# Patient Record
Sex: Female | Born: 1965 | Race: White | Hispanic: No | Marital: Married | Smoking: Never smoker
Health system: Southern US, Community
[De-identification: ages and names within clinical notes are randomized; demographics above are authoritative.]

## PROBLEM LIST (undated history)

## (undated) DIAGNOSIS — E785 Hyperlipidemia, unspecified: Secondary | ICD-10-CM

## (undated) HISTORY — PX: HERNIA REPAIR: SHX51

## (undated) HISTORY — DX: Hyperlipidemia, unspecified: E78.5

---

## 1998-08-03 ENCOUNTER — Inpatient Hospital Stay (HOSPITAL_COMMUNITY): Admission: AD | Admit: 1998-08-03 | Discharge: 1998-08-03 | Payer: Self-pay | Admitting: Obstetrics and Gynecology

## 1998-08-05 ENCOUNTER — Inpatient Hospital Stay (HOSPITAL_COMMUNITY): Admission: AD | Admit: 1998-08-05 | Discharge: 1998-08-07 | Payer: Self-pay | Admitting: Obstetrics and Gynecology

## 2000-02-01 ENCOUNTER — Encounter: Payer: Self-pay | Admitting: Obstetrics and Gynecology

## 2000-02-01 ENCOUNTER — Ambulatory Visit (HOSPITAL_COMMUNITY): Admission: RE | Admit: 2000-02-01 | Discharge: 2000-02-01 | Payer: Self-pay | Admitting: Obstetrics and Gynecology

## 2000-06-24 ENCOUNTER — Inpatient Hospital Stay (HOSPITAL_COMMUNITY): Admission: AD | Admit: 2000-06-24 | Discharge: 2000-06-26 | Payer: Self-pay | Admitting: Obstetrics and Gynecology

## 2001-12-01 ENCOUNTER — Encounter: Payer: Self-pay | Admitting: Obstetrics and Gynecology

## 2001-12-01 ENCOUNTER — Ambulatory Visit (HOSPITAL_COMMUNITY): Admission: RE | Admit: 2001-12-01 | Discharge: 2001-12-01 | Payer: Self-pay | Admitting: Obstetrics and Gynecology

## 2002-02-05 ENCOUNTER — Encounter: Payer: Self-pay | Admitting: Obstetrics and Gynecology

## 2002-02-05 ENCOUNTER — Ambulatory Visit (HOSPITAL_COMMUNITY): Admission: RE | Admit: 2002-02-05 | Discharge: 2002-02-05 | Payer: Self-pay | Admitting: Obstetrics and Gynecology

## 2002-03-16 ENCOUNTER — Ambulatory Visit (HOSPITAL_COMMUNITY): Admission: RE | Admit: 2002-03-16 | Discharge: 2002-03-16 | Payer: Self-pay | Admitting: Obstetrics and Gynecology

## 2002-03-16 ENCOUNTER — Encounter: Payer: Self-pay | Admitting: Obstetrics and Gynecology

## 2002-04-20 ENCOUNTER — Inpatient Hospital Stay (HOSPITAL_COMMUNITY): Admission: AD | Admit: 2002-04-20 | Discharge: 2002-04-22 | Payer: Self-pay | Admitting: Obstetrics and Gynecology

## 2003-02-28 ENCOUNTER — Other Ambulatory Visit: Admission: RE | Admit: 2003-02-28 | Discharge: 2003-02-28 | Payer: Self-pay | Admitting: Obstetrics and Gynecology

## 2004-03-27 ENCOUNTER — Other Ambulatory Visit: Admission: RE | Admit: 2004-03-27 | Discharge: 2004-03-27 | Payer: Self-pay | Admitting: Obstetrics and Gynecology

## 2004-06-02 ENCOUNTER — Ambulatory Visit: Payer: Self-pay | Admitting: Family Medicine

## 2005-05-07 ENCOUNTER — Other Ambulatory Visit: Admission: RE | Admit: 2005-05-07 | Discharge: 2005-05-07 | Payer: Self-pay | Admitting: Obstetrics and Gynecology

## 2007-02-14 ENCOUNTER — Encounter: Admission: RE | Admit: 2007-02-14 | Discharge: 2007-02-14 | Payer: Self-pay | Admitting: Obstetrics and Gynecology

## 2008-02-19 ENCOUNTER — Encounter: Admission: RE | Admit: 2008-02-19 | Discharge: 2008-02-19 | Payer: Self-pay | Admitting: Family Medicine

## 2009-04-23 ENCOUNTER — Encounter: Admission: RE | Admit: 2009-04-23 | Discharge: 2009-04-23 | Payer: Self-pay | Admitting: Obstetrics and Gynecology

## 2010-11-20 NOTE — Discharge Summary (Signed)
NAME:  Crystal Mayer, Crystal Mayer                        ACCOUNT NO.:  [id number]   MEDICAL RECORD NO.:  [id number]                   PATIENT TYPE:  INP   LOCATION:  9139                                 FACILITY:  [facility]   PHYSICIAN:  Huel Cote, M.D.              DATE OF BIRTH:  September 15, 1965   DATE OF ADMISSION:  04/20/2002  DATE OF DISCHARGE:  04/22/2002                                 DISCHARGE SUMMARY   DISCHARGE DIAGNOSES:  1. Term pregnancy at 38 weeks delivered.  2. Fetal pyelectasis.  3. Status post normal spontaneous vaginal delivery.  4. Advanced maternal age.  5. Group B strep positive status.   DISCHARGE MEDICATIONS:  1. Motrin 600 mg every 6 hours p.r.n.  2. Percocet 1-2 tablets p.o. every 4 hours p.r.n.   DISCHARGE FOLLOW UP:  The patient is to follow up in six weeks for her  routine postpartum exam.   HOSPITAL COURSE:  The patient is a 45 year old G 3, P 2-0-0-2 who was  admitted at 38+ weeks for induction given a favorable cervix and history of  macrosomia.  Prenatal care had been complicated by fetal pyelectasis which  was persistent at 34 weeks and was to be followed up with a postnatal  ultrasound.  The patient was also advanced maternal age; however, declined  amniocentesis and had a positive group B strep status which was to be  treated in labor.   PRENATAL LABORATORY DATA:  A positive, antibody negative, RPR nonreactive,  rubella immune, hepatitis B surface antigen negative, HIV declined. GC  negative, Chlamydia negative, triple screen negative.  One-hour Glucola 107.   PAST OBSTETRICAL HISTORY:  In 2000, she had a 6 pound 15 ounce vacuum  delivery.  In 2001, she had a 9 pound 7 ounce normal spontaneous vaginal  delivery.   PAST GYNECOLOGICAL HISTORY:  None.   PAST MEDICAL HISTORY:  History of asthma not requiring medication.  History  of anxiety.   PAST SURGICAL HISTORY:  Umbilical hernia repair.   HOSPITAL COURSE:  She was afebrile with stable vital  signs on admission,  fetal heart rate was reactive.  Estimated fetal weight was 7 pounds.  Cervix  was 50%, 2-3 cm and minus 2 station.  She had assisted rupture of membranes  with clear fluid obtained and was placed on penicillin for her group B strep  positive status.  She progressed very quickly throughout the day and reached  complete dilation several hours later with a normal spontaneous vaginal  delivery of a vigorous female infant over a first-degree perineal  laceration.  Apgars of 9 and 9.  Weight was 6 pounds 14 ounces.  Placenta  delivered spontaneously.  First-degree laceration was repaired with 2-0  Vicryl for hemostasis and cervix and rectum were intact.  On postpartum day  #2, she was doing very well.  Her pain was well controlled and she was felt  stable for discharge home.  Therefore was discharged for followup in  approximately six weeks for her routine postpartum exam.                                                Huel Cote, M.D.    KR/MEDQ  D:  04/22/2002  T:  04/23/2002  Job:  [id number]

## 2010-11-20 NOTE — Discharge Summary (Signed)
[facility]  Patient:    Crystal Mayer, Crystal Mayer                     MRN: [id number] Adm. Date:  [id number] Disc. Date: 06/26/00 Attending:  Oliver Pila                           Discharge Summary  DISCHARGE DIAGNOSES:          1. Term pregnancy at 39 weeks, delivered.                               2. Status post normal spontaneous vaginal                                  delivery.  DISCHARGE MEDICATIONS:        1. Motrin 600 mg p.o. q.6h. p.r.n.                               2. Percocet one to two tablets p.o. q.4h. p.r.n.  HOSPITAL FOLLOW-UP:           Patient is to follow up in six weeks for her routine postpartum examination.  HOSPITAL COURSE:              Patient is a 45 year old G2, P1-0-0-1 who was admitted at 39+ weeks with complaint of ruptured membranes at about 5:30 a.m. with a slow leak.  She had occasional contractions, no vaginal bleeding. Prenatal care had been uncomplicated.  PRENATAL LABORATORIES:        Blood type A+.  Antibody negative.  RPR negative.  Rubella immune.  Hepatitis B surface antigen.  HIV declined.  GC negative.  Chlamydia negative.  GBS negative.  PAST OBSTETRICAL HISTORY:     In February 2000 patient had a vacuum delivery of a 6 pound 15 ounce infant.  PAST GYNECOLOGICAL HISTORY:   None.  PAST MEDICAL HISTORY:         Patient had asthma which was weather induced only.  PAST SURGICAL HISTORY:        Umbilical hernia repair at 45 years old.  ALLERGIES:                    None.  MEDICATIONS:                  Prenatal vitamins.  PHYSICAL EXAMINATION:  VITAL SIGNS:                  She was afebrile with stable vital signs.  Fetal heart rate was reactive.  She had irregular mild contractions.  PELVIC:                       Cervix:  80% effaced, 4-5 cm, -1 station with positive gross fluid noted and positive four bag.                                Patient was admitted and had rupture of membranes of four  bag and then progressed to complete dilation with minimal Pitocin.  She pushed well with a normal spontaneous vaginal delivery of a vigorous female infant over a  midline episiotomy.  Apgars were 9 and 9.  Weight was 9 pounds 7 ounces.  Placenta delivered spontaneously with a three vessel cord.  Midline episiotomy was repaired with 2-0 and 3-0 Vicryl.  Some revision of her old episiotomy scar was necessary on the perineal surface for redundant mucosa, however, the patients cervix and rectum were intact.  EBL was less than 500 cc.  On postpartum day #2 she was doing great, afebrile.  Vital signs were stable.  She was breast-feeding without problem.  Her lochia was normal. Therefore, she was discharged to home with follow-up and medications as previously stated. DD:  06/26/00 TD:  06/26/00 Job: [id number]

## 2011-04-07 ENCOUNTER — Other Ambulatory Visit (HOSPITAL_COMMUNITY): Payer: Self-pay | Admitting: Obstetrics and Gynecology

## 2011-04-07 DIAGNOSIS — Z1231 Encounter for screening mammogram for malignant neoplasm of breast: Secondary | ICD-10-CM

## 2011-04-28 ENCOUNTER — Ambulatory Visit (HOSPITAL_COMMUNITY)
Admission: RE | Admit: 2011-04-28 | Discharge: 2011-04-28 | Disposition: A | Discharge status: Home / Self Care | Payer: Managed Care, Other (non HMO) | Source: Ambulatory Visit | Attending: Obstetrics and Gynecology | Admitting: Obstetrics and Gynecology

## 2011-04-28 DIAGNOSIS — Z1231 Encounter for screening mammogram for malignant neoplasm of breast: Secondary | ICD-10-CM

## 2011-05-28 ENCOUNTER — Other Ambulatory Visit: Payer: Self-pay | Admitting: Neurology

## 2013-03-28 ENCOUNTER — Other Ambulatory Visit (HOSPITAL_COMMUNITY): Payer: Self-pay | Admitting: Family Medicine

## 2013-03-28 DIAGNOSIS — Z1231 Encounter for screening mammogram for malignant neoplasm of breast: Secondary | ICD-10-CM

## 2013-04-04 ENCOUNTER — Ambulatory Visit (HOSPITAL_COMMUNITY)
Admission: RE | Admit: 2013-04-04 | Discharge: 2013-04-04 | Disposition: A | Discharge status: Home / Self Care | Payer: Managed Care, Other (non HMO) | Source: Ambulatory Visit | Attending: Family Medicine | Admitting: Family Medicine

## 2013-04-04 DIAGNOSIS — Z1231 Encounter for screening mammogram for malignant neoplasm of breast: Secondary | ICD-10-CM | POA: Insufficient documentation

## 2013-04-10 ENCOUNTER — Other Ambulatory Visit: Payer: Self-pay | Admitting: Family Medicine

## 2013-04-10 DIAGNOSIS — R928 Other abnormal and inconclusive findings on diagnostic imaging of breast: Secondary | ICD-10-CM

## 2013-04-23 ENCOUNTER — Other Ambulatory Visit: Payer: Self-pay | Admitting: Family Medicine

## 2013-04-23 ENCOUNTER — Ambulatory Visit
Admission: RE | Admit: 2013-04-23 | Discharge: 2013-04-23 | Disposition: A | Discharge status: Home / Self Care | Payer: Managed Care, Other (non HMO) | Source: Ambulatory Visit | Attending: Family Medicine | Admitting: Family Medicine

## 2013-04-23 DIAGNOSIS — R921 Mammographic calcification found on diagnostic imaging of breast: Secondary | ICD-10-CM

## 2013-04-23 DIAGNOSIS — R928 Other abnormal and inconclusive findings on diagnostic imaging of breast: Secondary | ICD-10-CM

## 2013-04-26 ENCOUNTER — Ambulatory Visit
Admission: RE | Admit: 2013-04-26 | Discharge: 2013-04-26 | Disposition: A | Discharge status: Home / Self Care | Payer: Managed Care, Other (non HMO) | Source: Ambulatory Visit | Attending: Family Medicine | Admitting: Family Medicine

## 2013-04-26 ENCOUNTER — Other Ambulatory Visit: Payer: Self-pay | Admitting: Family Medicine

## 2013-04-26 DIAGNOSIS — R921 Mammographic calcification found on diagnostic imaging of breast: Secondary | ICD-10-CM

## 2014-04-11 ENCOUNTER — Other Ambulatory Visit (HOSPITAL_COMMUNITY): Payer: Self-pay | Admitting: Obstetrics and Gynecology

## 2014-04-11 DIAGNOSIS — Z1231 Encounter for screening mammogram for malignant neoplasm of breast: Secondary | ICD-10-CM

## 2014-04-30 ENCOUNTER — Ambulatory Visit (HOSPITAL_COMMUNITY)
Admission: RE | Admit: 2014-04-30 | Discharge: 2014-04-30 | Disposition: A | Discharge status: Home / Self Care | Payer: Managed Care, Other (non HMO) | Source: Ambulatory Visit | Attending: Obstetrics and Gynecology | Admitting: Obstetrics and Gynecology

## 2014-04-30 DIAGNOSIS — Z1231 Encounter for screening mammogram for malignant neoplasm of breast: Secondary | ICD-10-CM

## 2014-05-02 ENCOUNTER — Other Ambulatory Visit: Payer: Self-pay | Admitting: Obstetrics and Gynecology

## 2014-05-02 DIAGNOSIS — R928 Other abnormal and inconclusive findings on diagnostic imaging of breast: Secondary | ICD-10-CM

## 2014-05-15 ENCOUNTER — Ambulatory Visit
Admission: RE | Admit: 2014-05-15 | Discharge: 2014-05-15 | Disposition: A | Discharge status: Home / Self Care | Payer: Managed Care, Other (non HMO) | Source: Ambulatory Visit | Attending: Obstetrics and Gynecology | Admitting: Obstetrics and Gynecology

## 2014-05-15 DIAGNOSIS — R928 Other abnormal and inconclusive findings on diagnostic imaging of breast: Secondary | ICD-10-CM

## 2014-05-22 ENCOUNTER — Inpatient Hospital Stay: Admission: RE | Admit: 2014-05-22 | Payer: Managed Care, Other (non HMO) | Source: Ambulatory Visit

## 2014-05-22 ENCOUNTER — Other Ambulatory Visit: Payer: Managed Care, Other (non HMO)

## 2014-10-03 ENCOUNTER — Ambulatory Visit (INDEPENDENT_AMBULATORY_CARE_PROVIDER_SITE_OTHER): Payer: BC Managed Care – PPO | Admitting: Podiatry

## 2014-10-03 ENCOUNTER — Ambulatory Visit (INDEPENDENT_AMBULATORY_CARE_PROVIDER_SITE_OTHER): Payer: BC Managed Care – PPO

## 2014-10-03 ENCOUNTER — Encounter: Payer: Self-pay | Admitting: Podiatry

## 2014-10-03 VITALS — BP 96/63 | HR 72 | Resp 10 | Ht 60.0 in | Wt 120.0 lb

## 2014-10-03 DIAGNOSIS — M79671 Pain in right foot: Secondary | ICD-10-CM

## 2014-10-03 DIAGNOSIS — M2040 Other hammer toe(s) (acquired), unspecified foot: Secondary | ICD-10-CM | POA: Diagnosis not present

## 2014-10-03 DIAGNOSIS — M2011 Hallux valgus (acquired), right foot: Secondary | ICD-10-CM

## 2014-10-03 DIAGNOSIS — M79675 Pain in left toe(s): Secondary | ICD-10-CM

## 2014-10-03 DIAGNOSIS — M21611 Bunion of right foot: Secondary | ICD-10-CM

## 2014-10-03 NOTE — Progress Notes (Signed)
   Subjective:    Patient ID: Crystal Mayer, female    DOB: [birth year], 49 y.o.   MRN: [id number]  HPI Comments: Pt states she has begun to have pain and redness to the right 1st MPJ for about 5 months.  Pt states she was evaluated by DR. Petrinitz over 2 years ago for the right 2nd hammer toe, and in the last 5 months left 2nd MPJ has developed a hammer toe.     Review of Systems  HENT: Positive for hearing loss.   All other systems reviewed and are negative.      Objective:   Physical Exam        Assessment & Plan:

## 2014-10-03 NOTE — Patient Instructions (Signed)
Pre-Operative Instructions  Congratulations, you have decided to take an important step to improving your quality of life.  You can be assured that the doctors of [facility] will be with you every step of the way.  1. Plan to be at the surgery center/hospital at least 1 (one) hour prior to your scheduled time unless otherwise directed by the surgical center/hospital staff.  You must have a responsible adult accompany you, remain during the surgery and drive you home.  Make sure you have directions to the surgical center/hospital and know how to get there on time. 2. For hospital based surgery you will need to obtain a history and physical form from your family physician within 1 month prior to the date of surgery- we will give you a form for you primary physician.  3. We make every effort to accommodate the date you request for surgery.  There are however, times where surgery dates or times have to be moved.  We will contact you as soon as possible if a change in schedule is required.   4. No Aspirin/Ibuprofen for one week before surgery.  If you are on aspirin, any non-steroidal anti-inflammatory medications (Mobic, Aleve, Ibuprofen) you should stop taking it 7 days prior to your surgery.  You make take Tylenol  For pain prior to surgery.  5. Medications- If you are taking daily heart and blood pressure medications, seizure, reflux, allergy, asthma, anxiety, pain or diabetes medications, make sure the surgery center/hospital is aware before the day of surgery so they may notify you which medications to take or avoid the day of surgery. 6. No food or drink after midnight the night before surgery unless directed otherwise by surgical center/hospital staff. 7. No alcoholic beverages 24 hours prior to surgery.  No smoking 24 hours prior to or 24 hours after surgery. 8. Wear loose pants or shorts- loose enough to fit over bandages, boots, and casts. 9. No slip on shoes, sneakers are best. 10. Bring  your boot with you to the surgery center/hospital.  Also bring crutches or a walker if your physician has prescribed it for you.  If you do not have this equipment, it will be provided for you after surgery. 11. If you have not been contracted by the surgery center/hospital by the day before your surgery, call to confirm the date and time of your surgery. 12. Leave-time from work may vary depending on the type of surgery you have.  Appropriate arrangements should be made prior to surgery with your employer. 13. Prescriptions will be provided immediately following surgery by your doctor.  Have these filled as soon as possible after surgery and take the medication as directed. 14. Remove nail polish on the operative foot. 15. Wash the night before surgery.  The night before surgery wash the foot and leg well with the antibacterial soap provided and water paying special attention to beneath the toenails and in between the toes.  Rinse thoroughly with water and dry well with a towel.  Perform this wash unless told not to do so by your physician.  Enclosed: 1 Ice pack (please put in freezer the night before surgery)   1 Hibiclens skin cleaner   Pre-op Instructions  If you have any questions regarding the instructions, do not hesitate to call our office.  [place]: [address] [place], [place] [address] [phone]  [place]: [address] [place], [place] [address] [phone]  [place]: [address]  [place], [place] [address] [phone]  Dr. Richard   Tuchman DPM, Dr. Norman Regal DPM Dr. Richard Sikora DPM, Dr. M. Todd Hyatt DPM, Dr. Kathryn Egerton DPM 

## 2014-10-17 ENCOUNTER — Encounter: Payer: Self-pay | Admitting: Podiatry

## 2014-10-17 ENCOUNTER — Ambulatory Visit (INDEPENDENT_AMBULATORY_CARE_PROVIDER_SITE_OTHER): Payer: BC Managed Care – PPO | Admitting: Podiatry

## 2014-10-17 VITALS — BP 102/67 | HR 60 | Resp 12

## 2014-10-17 DIAGNOSIS — M2040 Other hammer toe(s) (acquired), unspecified foot: Secondary | ICD-10-CM

## 2014-10-17 DIAGNOSIS — M2011 Hallux valgus (acquired), right foot: Secondary | ICD-10-CM | POA: Diagnosis not present

## 2014-10-17 DIAGNOSIS — M21611 Bunion of right foot: Secondary | ICD-10-CM

## 2014-10-17 DIAGNOSIS — M79671 Pain in right foot: Secondary | ICD-10-CM

## 2014-10-18 NOTE — Progress Notes (Signed)
Subjective:     Patient ID: Crystal SilviusStephanie R Mayer, female   DOB: [birth year], 49 y.o.   MRN: [id number]  HPI patient presents for correction of bunion right foot and hammertoe deformity of the second toes of both feet. States that it's been going on for a long time getting worse over time and that she has difficulty walking or wearing shoe gear. States that she's tried wider shoes and padding without relief   Review of Systems     Objective:   Physical Exam Neurovascular status intact muscle strength adequate with range of motion subtalar midtarsal joint within normal limits. 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 Patient wants surgical intervention and I allowed her to read consent form for correction reviewing with her all possible complications and alternative treatments as listed. She understands is no guarantee of the told recovery. Take 6 months to one year and she is [facility] the next several weeks and is dispensed air fracture walker with her right foot with instructions on usage

## 2014-10-21 ENCOUNTER — Telehealth: Payer: Self-pay | Admitting: Podiatry

## 2014-10-21 NOTE — Telephone Encounter (Signed)
Patient is currently scheduled for surgery on 05/10 and wants to reschedule it to 08/09. Please call her at [phone]. Thank you.

## 2014-10-22 NOTE — Telephone Encounter (Signed)
I called and left patient a message that I will reschedule surgery from 11/12/2014 to 02/11/2015.  Call if you would like to make any further changes.  I called surgical center and rescheduled surgery.

## 2014-10-22 NOTE — Telephone Encounter (Signed)
New post-op appointment is Monday 02/17/2015 @ 1:15pm.

## 2014-10-23 ENCOUNTER — Telehealth: Payer: Self-pay | Admitting: *Deleted

## 2014-10-23 NOTE — Telephone Encounter (Signed)
I called patient to see if she had called.  "No, I called yesterday but I hadn't called back.  You left me a message that it was fine to reschedule my surgery.  I do have a question.  They gave me a boot.  Should I return it or just hold on to it until time of surgery.  You can hold on it until surgery date or you can return it, it's left up to you.  "Alright, thanks for calling me back."

## 2014-12-18 IMAGING — MG MM DIAGNOSTIC LTD LEFT
2 series · 2 of 2 positions shown · non-contrast
Comparison: Multiple priors

CLINICAL DATA: Abnormal screening mammogram

EXAM:
DIGITAL DIAGNOSTIC  LEFT MAMMOGRAM

[L CC]
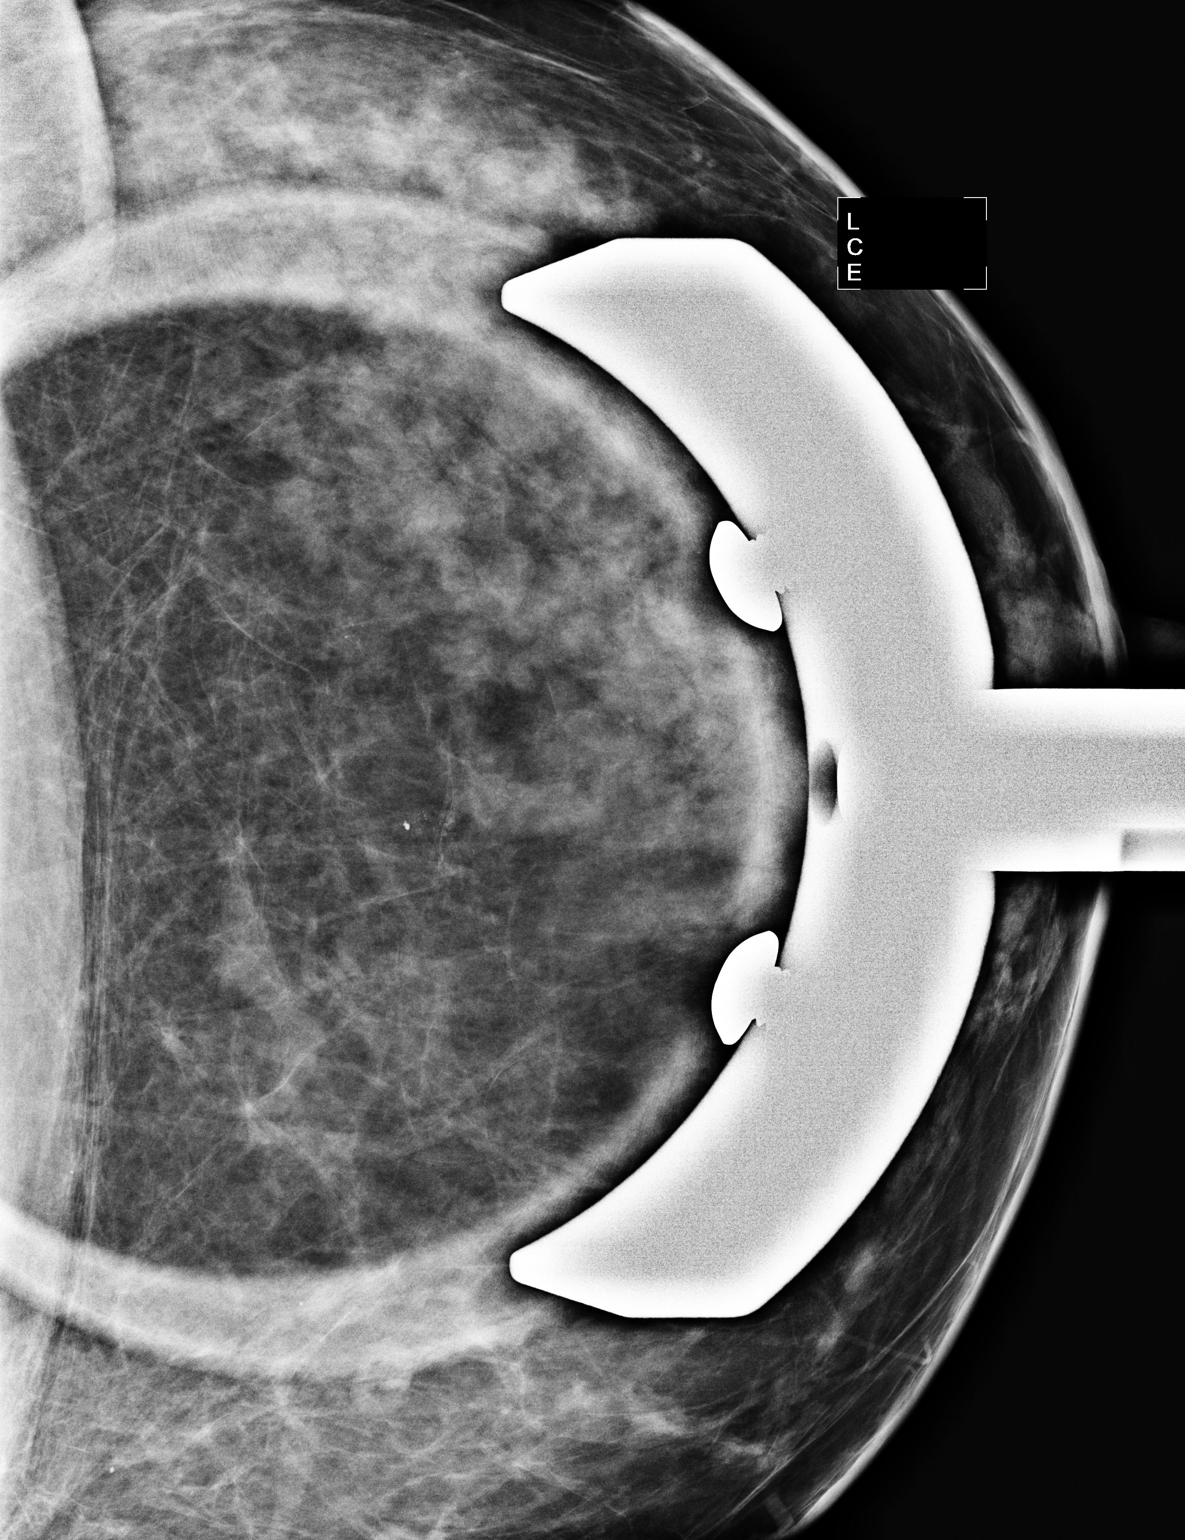

[L ML]
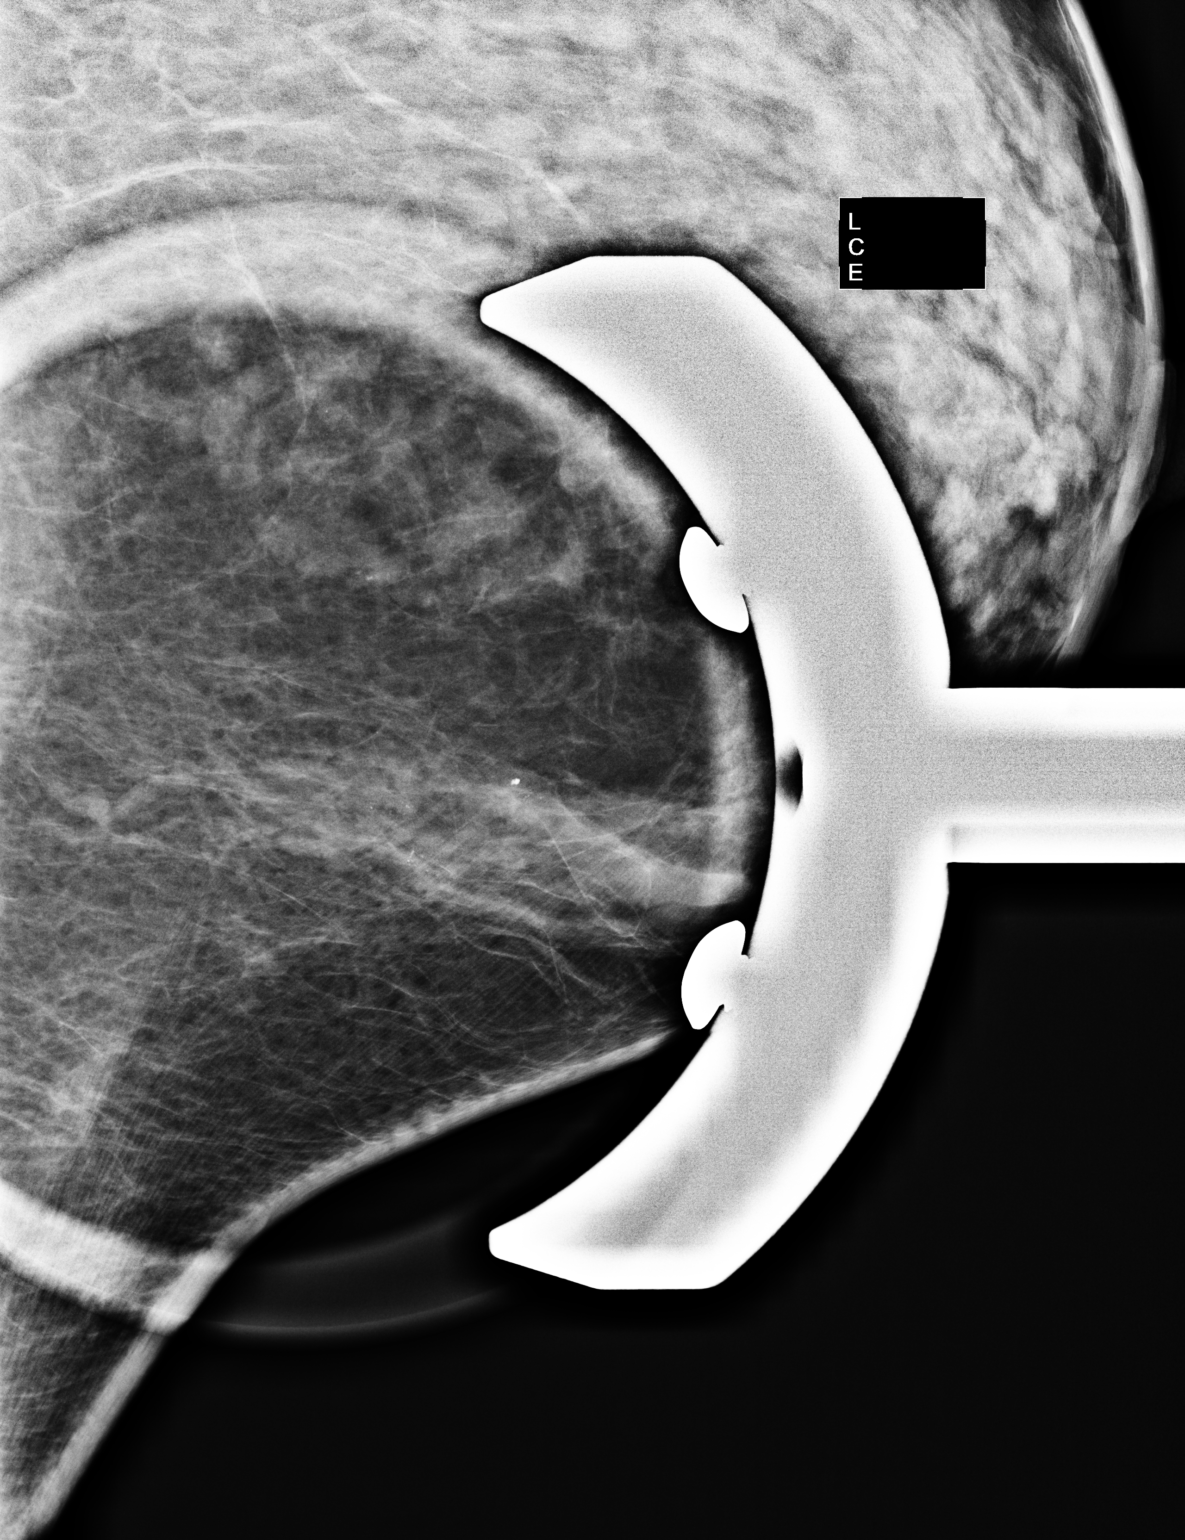

[2 of 2 positions shown; findings below may reference images not displayed]

ACR Breast Density Category b: There are scattered areas of
fibroglandular density.
FINDINGS: Two groups of calcifications are identified in the central lower
left breast. The more lateral group measures 2 x 2 x 3 mm. The more
medial group measures 2 x 3 x 5 mm and contains a single coarse
calcification. These groups are separated by 1.4 cm.
IMPRESSION: Suspicious left breast calcifications.

RECOMMENDATION:
Stereotactic left breast biopsy is recommended.

I have discussed the findings and recommendations with the patient.
Results were also provided in writing at the conclusion of the
visit. If applicable, a reminder letter will be sent to the patient
regarding the next appointment.

BI-RADS CATEGORY  4: Suspicious abnormality - biopsy should be
considered.

## 2014-12-21 IMAGING — MG MM BREAST STEREO BIOPSY LEFT
4 series · 4 of 4 positions shown · non-contrast
Comparison: Previous exams.

ADDENDUM:
Histologic evaluation demonstrates benign breast tissue
withcalcifications. Calcifications are present and benign lobules.
There is fibrocystic change, usual ductal hyperplasia and columnar
hyperplasia/change. No atypia or malignancy is identified. These
findings are concordant with the imaging findings. Results were
discussedwith the patient by telephone at her request. She reports a
large amount of bruising which would be expected given the hematoma
formation after the procedure. The patient was counseled that the
bruising and discomfort may last for several months. Yearly
screening mammography is suggested.
CLINICAL DATA: Left breast calcifications; 2 sites

EXAM:
STEREOTACTIC CORE NEEDLE BIOPSY; RADIOLOGY EXAMINATION

[L SPECIMEN (1 of 2)]
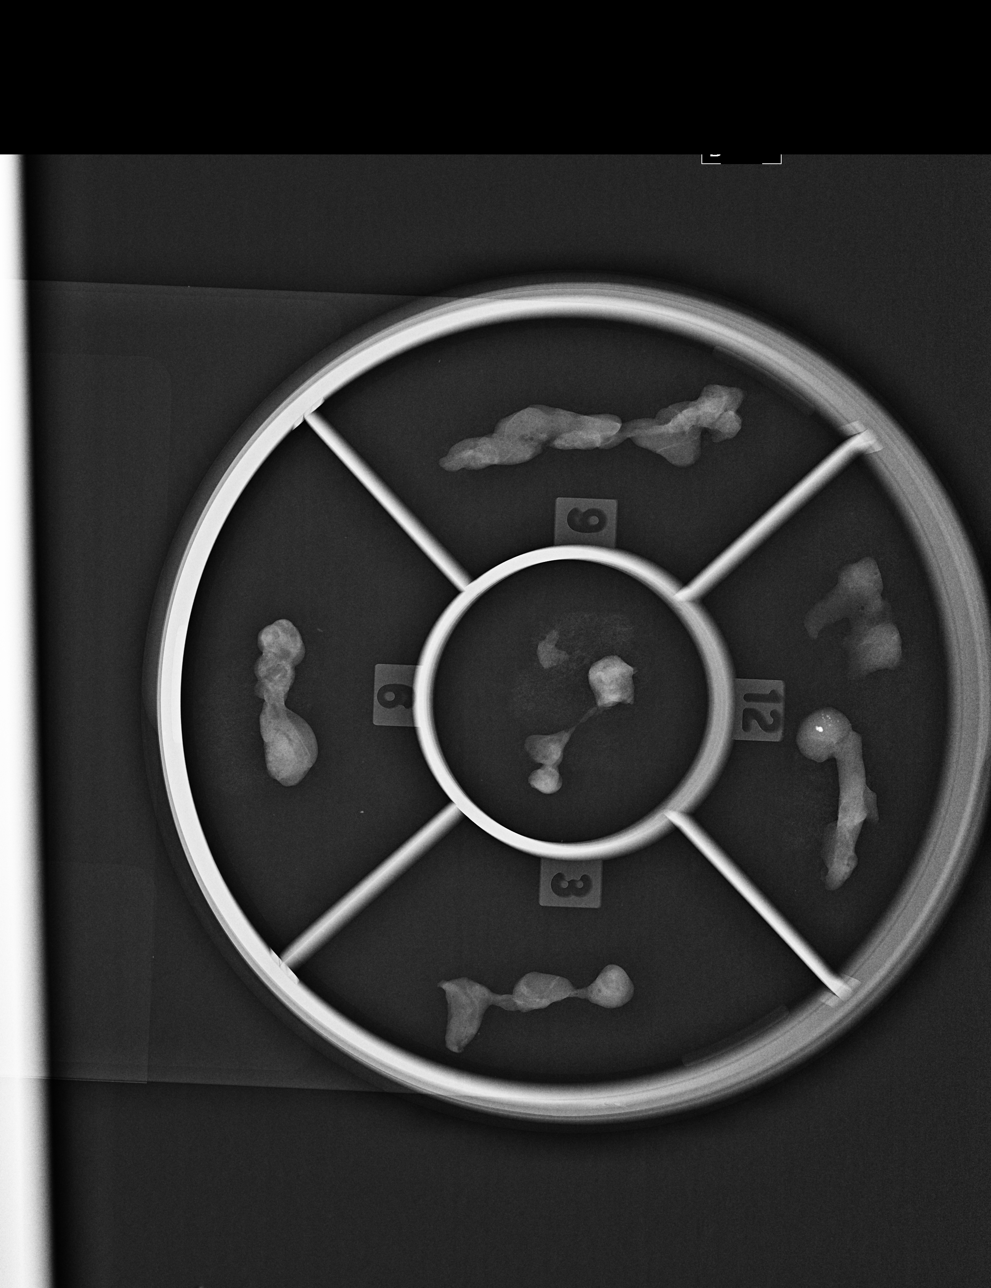

[L CC]
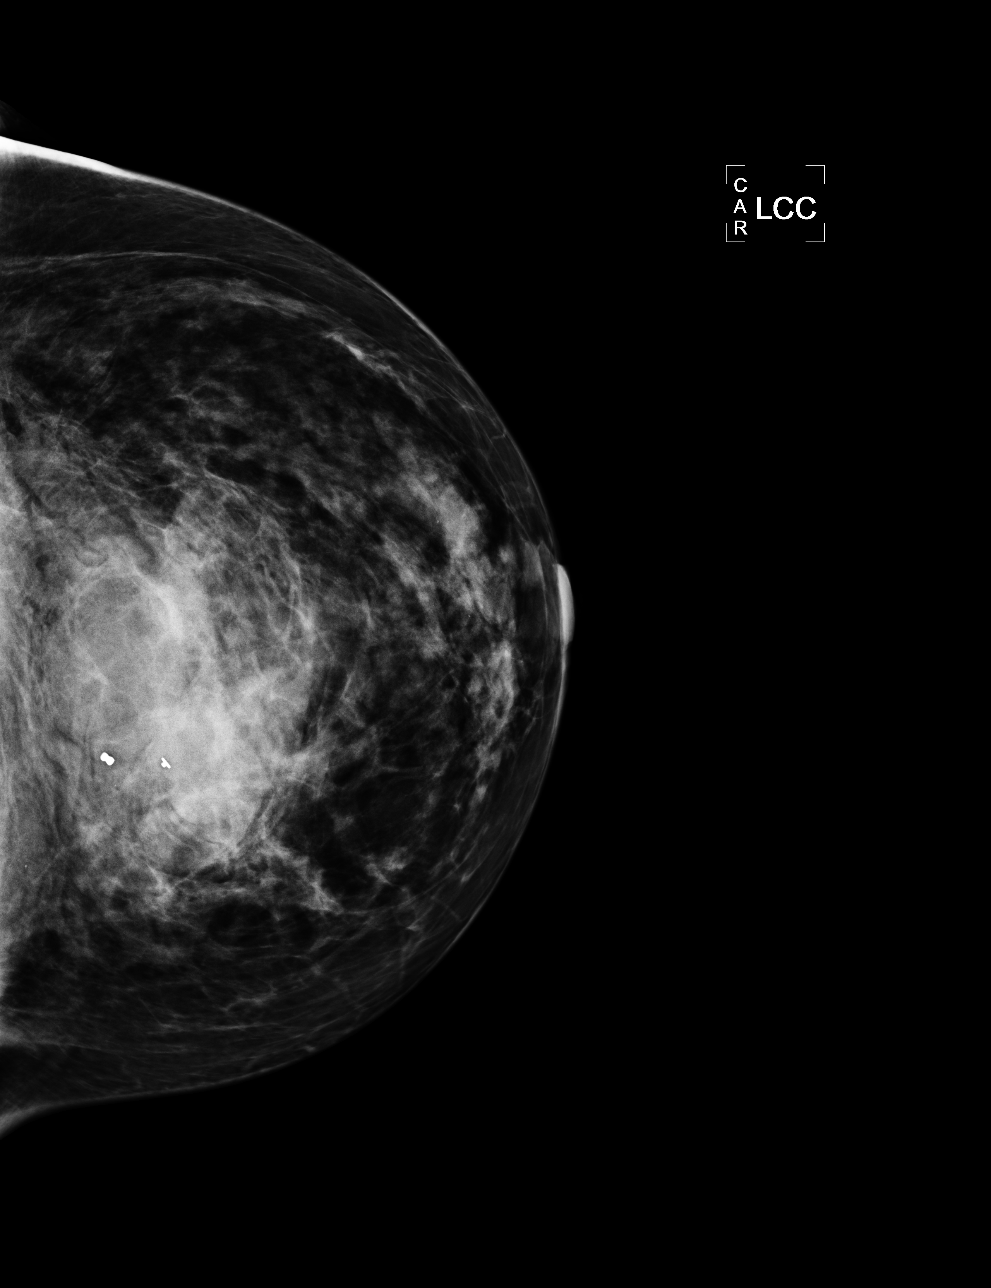

[L ML]
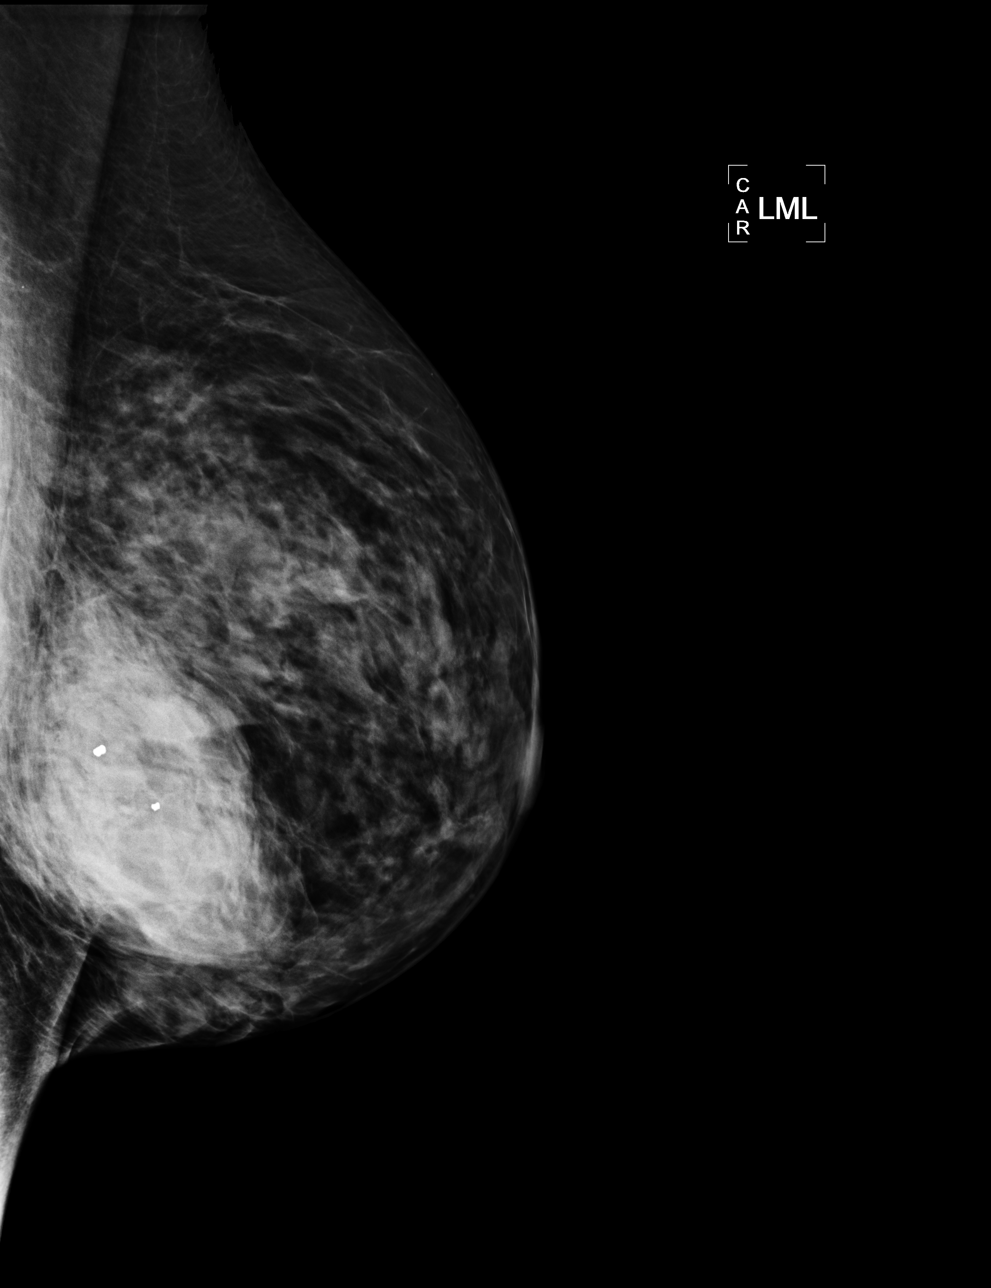

[L SPECIMEN (2 of 2)]
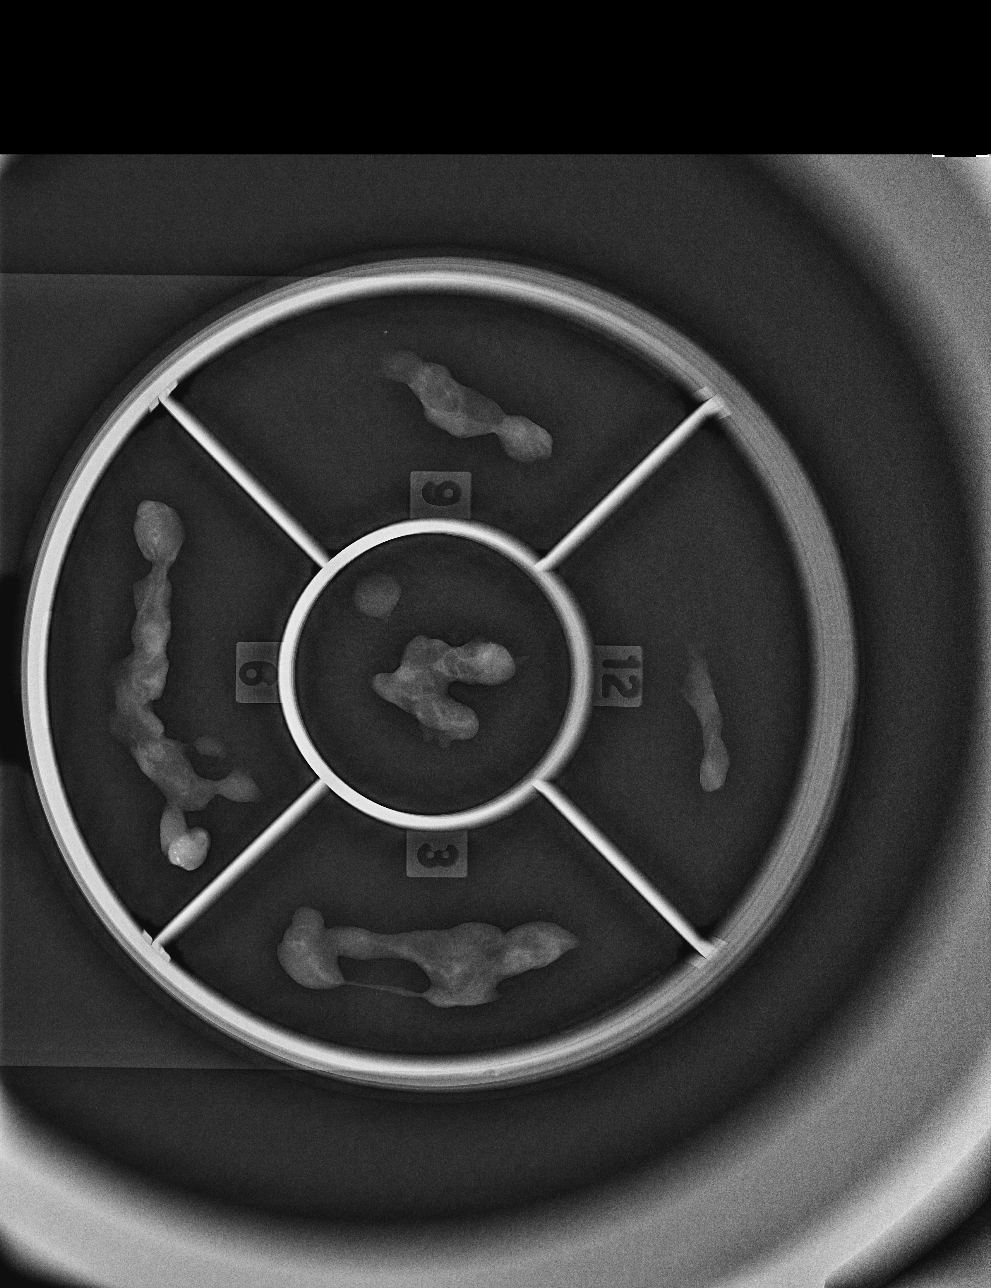

[4 of 4 positions shown; findings below may reference images not displayed]

FINDINGS: I met with the patient and we discussed the procedure of
stereotactic-guided biopsy, including benefits and alternatives. We
discussed the high likelihood of a successful procedure. We
discussed the risks of the procedure, including infection, bleeding,
tissue injury, clip migration, and inadequate sampling. Informed,
written consent was given.

Site 1: Dumbbell clip; lateral

Using sterile technique and 2% Lidocaine as local anesthetic, under
stereotactic guidance, a 9 gauge vacuum assisted device was used to
perform core needle biopsy of calcifications within the 6 o'clock
position left breast using an inferior approach. Specimen radiograph
was performed, showing calcifications. Specimens with calcifications
are identified for pathology.

At the conclusion of the procedure, a dumbbell shaped tissue marker
clip was deployed into the biopsy cavity. Follow-up 2-view mammogram
confirmed clip in appropriate position.

Site 2:  T Shaped clip; medial

Using sterile technique and 2% Lidocaine as local anesthetic, under
stereotactic guidance, a 9 gauge vacuum assisted device was used to
perform core needle biopsy of calcifications in the 6 o'clock
position left breast using an inferior approach. Specimen radiograph
was performed, showing calcifications. Specimens with calcifications
are identified for pathology.

At the conclusion of the procedure, a T-shaped tissue marker clip
was deployed into the biopsy cavity. Follow-up 2-view mammogram
confirmed clip in appropriate position.
IMPRESSION: 1. Stereotactic-guided biopsy of left breast calcifications, 2
sites. (Site 1: Dumbbell clip) (Site 2: T-shaped clip).
2. There was hematoma formation after biopsy of the 2nd site.
Patient was advised to keep the pressure bandages in place as well
as use ice on the left intermittently for the next few hours. She
was advised to call the office if she felt the hematoma was
enlarging or and she had any unexpected bleeding.

## 2015-01-13 ENCOUNTER — Telehealth: Payer: Self-pay | Admitting: *Deleted

## 2015-01-13 NOTE — Telephone Encounter (Signed)
"  I'm scheduled for surgery on 02/11/2015.  I need to reschedule to 06/24/2015.  Is that date available?"  As far as I know that date is available.  He has not informed whether or not if he's taking any time off for the Holidays.  "You'll let me know if he decides to take time off during that time correct?"  Yes, I will let you know.  "Okay, thank you."  I called and rescheduled surgery at the surgical center.

## 2015-04-10 ENCOUNTER — Telehealth: Payer: Self-pay | Admitting: *Deleted

## 2015-04-10 NOTE — Telephone Encounter (Signed)
I left messages for patient to give me a call back regarding rescheduling appointment date.  I need to reschedule patient's surgery date, Dr. Charlsie Merles will not be performing surgery on 06/24/2015/

## 2015-04-15 NOTE — Telephone Encounter (Signed)
"  I'm returning your call.  You said I needed to reschedule my surgery."  Yes, Dr. Charlsie Merles is going to be out of the office the week of the 20th.  So, we will need to reschedule your surgery.  He can do it December 13, 15 or 27.  "Go ahead and put me down for the 27th.  You said I have the option of maybe doing it at lunch time on the 15th.  Let me check with my husband and see what's good for him because he's going to have to drive me to and from [place].  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  I'll let you know if I need to reschedule it."

## 2015-04-16 ENCOUNTER — Telehealth: Payer: Self-pay | Admitting: *Deleted

## 2015-04-16 NOTE — Telephone Encounter (Signed)
"  I spoke to you yesterday about rescheduling my surgery.  I had asked you to put me down for 12/27.  I spoke with my husband and he felt 12/15 would be better.  Is that date still available?"  Yes, it is.  I'll reschedule it to 06/19/2015.  I called and rescheduled surgery from 07/01/2015 to 06/19/2015 with [facility].

## 2015-06-06 ENCOUNTER — Telehealth: Payer: Self-pay | Admitting: *Deleted

## 2015-06-06 NOTE — Telephone Encounter (Signed)
Pt has surgery questions. I answered surgery to the best of my ability.

## 2015-06-16 ENCOUNTER — Telehealth: Payer: Self-pay | Admitting: *Deleted

## 2015-06-16 NOTE — Telephone Encounter (Signed)
"  I'm scheduled for surgery on Thursday.  I rescheduled this surgery about 4 times since April.  I don't know the arrival time."  Surgical center will call you a day or two prior to surgery date.  They will give you the arrival time.  "I need to know so I can make arrangements.  My husband needs to know what time he needs to take off from work.  This has been the most random experience I've ever had."  You are welcome to call the surgical center.  They may be able to give you the arrival time.

## 2015-06-18 DIAGNOSIS — M2042 Other hammer toe(s) (acquired), left foot: Secondary | ICD-10-CM | POA: Diagnosis not present

## 2015-06-18 DIAGNOSIS — M2011 Hallux valgus (acquired), right foot: Secondary | ICD-10-CM | POA: Diagnosis not present

## 2015-06-18 DIAGNOSIS — M2041 Other hammer toe(s) (acquired), right foot: Secondary | ICD-10-CM | POA: Diagnosis not present

## 2015-06-19 ENCOUNTER — Encounter: Payer: Self-pay | Admitting: Podiatry

## 2015-06-20 ENCOUNTER — Telehealth: Payer: Self-pay | Admitting: *Deleted

## 2015-06-20 MED ORDER — OXYCODONE-ACETAMINOPHEN 10-325 MG PO TABS: 1.0000 | ORAL_TABLET | Freq: Three times a day (TID) | ORAL | Status: DC | PRN

## 2015-06-20 NOTE — Telephone Encounter (Signed)
Pt states had surgery and the hammer toe has an area of red blood the larger than the size of a 50 cent piece.  Told pt to come to the office for evaluation and possible reinforcement of the dressing.

## 2015-06-26 ENCOUNTER — Ambulatory Visit (INDEPENDENT_AMBULATORY_CARE_PROVIDER_SITE_OTHER): Payer: BC Managed Care – PPO

## 2015-06-26 ENCOUNTER — Ambulatory Visit (INDEPENDENT_AMBULATORY_CARE_PROVIDER_SITE_OTHER): Payer: BC Managed Care – PPO | Admitting: Podiatry

## 2015-06-26 VITALS — Temp 98.3°F

## 2015-06-26 DIAGNOSIS — Z9889 Other specified postprocedural states: Secondary | ICD-10-CM

## 2015-06-26 DIAGNOSIS — M21611 Bunion of right foot: Secondary | ICD-10-CM

## 2015-06-26 DIAGNOSIS — M79671 Pain in right foot: Secondary | ICD-10-CM

## 2015-06-26 DIAGNOSIS — M21612 Bunion of left foot: Secondary | ICD-10-CM

## 2015-06-30 NOTE — Progress Notes (Signed)
She presents today 1 week status post [facility] right foot, hammertoe repair with pins second digit bilateral. She denies fever chills nausea vomiting muscle aches and pains. She presents today Cam Walker right foot Darco shoe left foot.  Objective: Vital signs stable alert and oriented 3. Pulses are strongly palpable. Dry sterile dressings intact was removed demonstrates well-healing surgical sites. Sutures are intact margins well coapted minimal edema no erythema cellulitis drainage or odor. Radiographs demonstrate good position pins and toes bilateral.  Assessment: Well-healing surgical feet first week postop.  Plan: Redressed today dry sterile compressive dressing she will follow up with her primary surgeon in 1 week. Sutures will be removed at that point.

## 2015-07-01 ENCOUNTER — Other Ambulatory Visit: Payer: Self-pay

## 2015-07-03 ENCOUNTER — Other Ambulatory Visit: Payer: Self-pay

## 2015-07-09 ENCOUNTER — Ambulatory Visit (INDEPENDENT_AMBULATORY_CARE_PROVIDER_SITE_OTHER): Payer: BC Managed Care – PPO | Admitting: Podiatry

## 2015-07-09 DIAGNOSIS — M2040 Other hammer toe(s) (acquired), unspecified foot: Secondary | ICD-10-CM | POA: Diagnosis not present

## 2015-07-09 DIAGNOSIS — Z9889 Other specified postprocedural states: Secondary | ICD-10-CM | POA: Diagnosis not present

## 2015-07-09 DIAGNOSIS — M21611 Bunion of right foot: Secondary | ICD-10-CM | POA: Diagnosis not present

## 2015-07-10 ENCOUNTER — Other Ambulatory Visit: Payer: Self-pay

## 2015-07-11 ENCOUNTER — Other Ambulatory Visit: Payer: Self-pay

## 2015-07-14 NOTE — Progress Notes (Signed)
Subjective:     Patient ID: Crystal Mayer, female   DOB: Apr 25, 1966, 50 y.o.   MRN: [id number]  HPI patient states I'm doing very well with my right foot with minimal discomfort and able to walk distances without significant pain   Review of Systems     Objective:   Physical Exam  neurovascular status intact negative Homans sign was noted and wound edges well coapted with good alignment of the first metatarsal and digits with stitches in place and    Assessment:      doing well post osteotomy arthroplasty    Plan:      advised this patient on wider-type shoes utilizing immobilization and continue compression elevation and reviewed x-rays with patient

## 2015-07-23 ENCOUNTER — Ambulatory Visit (INDEPENDENT_AMBULATORY_CARE_PROVIDER_SITE_OTHER): Payer: BC Managed Care – PPO | Admitting: Podiatry

## 2015-07-23 ENCOUNTER — Ambulatory Visit (INDEPENDENT_AMBULATORY_CARE_PROVIDER_SITE_OTHER): Payer: BC Managed Care – PPO

## 2015-07-23 DIAGNOSIS — M2040 Other hammer toe(s) (acquired), unspecified foot: Secondary | ICD-10-CM

## 2015-07-23 DIAGNOSIS — M21611 Bunion of right foot: Secondary | ICD-10-CM | POA: Diagnosis not present

## 2015-07-23 DIAGNOSIS — Z9889 Other specified postprocedural states: Secondary | ICD-10-CM | POA: Diagnosis not present

## 2015-07-24 ENCOUNTER — Other Ambulatory Visit: Payer: BC Managed Care – PPO

## 2015-07-24 NOTE — Progress Notes (Signed)
Subjective:     Patient ID: Crystal Mayer, female   DOB: 1965/12/30, 50 y.o.   MRN: [id number]  HPI patient states I'm doing real well with minimal discomfort and able to walk distances without pain   Review of Systems     Objective:   Physical Exam Neurovascular status intact. In place second digit bilateral with good structural correction first MPJ right with wound edges well coapted    Assessment:     Doing well post osteotomy right digital fusion second right with pins in place and second left    Plan:     X-rays taken reviewed pins removed with sterile dressings applied and instructed on gradual return to saw shoe gear in the next 4 weeks or earlier if needed and reappoint 6 weeks or earlier if needed

## 2015-08-21 ENCOUNTER — Other Ambulatory Visit: Payer: BC Managed Care – PPO

## 2015-08-25 ENCOUNTER — Ambulatory Visit (INDEPENDENT_AMBULATORY_CARE_PROVIDER_SITE_OTHER): Payer: BC Managed Care – PPO | Admitting: Podiatry

## 2015-08-25 ENCOUNTER — Encounter: Payer: Self-pay | Admitting: Podiatry

## 2015-08-25 ENCOUNTER — Ambulatory Visit: Payer: Self-pay

## 2015-08-25 ENCOUNTER — Ambulatory Visit (INDEPENDENT_AMBULATORY_CARE_PROVIDER_SITE_OTHER): Payer: BC Managed Care – PPO

## 2015-08-25 VITALS — BP 113/65 | HR 66 | Resp 16

## 2015-08-25 DIAGNOSIS — Z9889 Other specified postprocedural states: Secondary | ICD-10-CM

## 2015-08-25 DIAGNOSIS — M21611 Bunion of right foot: Secondary | ICD-10-CM

## 2015-08-27 NOTE — Progress Notes (Signed)
Subjective:     Patient ID: Crystal Mayer, female   DOB: 12-31-1965, 50 y.o.   MRN: [id number]  HPI patient states that I'm doing pretty well and I'm walking with minimal discomfort but I was concerned because my second toe left with slightly elevated   Review of Systems     Objective:   Physical Exam Neurovascular status intact muscle strength adequate with good digital alignment digit 2 bilateral with structural bunion correction right that's doing well. Patient has minimal edema and good range of motion    Assessment:     Doing well post surgical intervention bilateral feet    Plan:     X-rays reviewed and at this time patient may return to normal shoe gear and activities and will be seen back on an as-needed basis. Discussed importance of keeping the second toes flexible at the MPJ and big toe range of motion right  X-ray report indicated pins are in place with good alignment of the second digit bilateral with minimal dorsal movement

## 2016-02-27 NOTE — Progress Notes (Signed)
DOS 06/19/2015 [place] bunionectomy (cutting and removing bone) w/oin fixation right, Fusion w/pin 2nd toe both feet

## 2022-10-19 ENCOUNTER — Emergency Department (HOSPITAL_BASED_OUTPATIENT_CLINIC_OR_DEPARTMENT_OTHER): Payer: Managed Care, Other (non HMO)

## 2022-10-19 ENCOUNTER — Emergency Department (HOSPITAL_BASED_OUTPATIENT_CLINIC_OR_DEPARTMENT_OTHER): Payer: Managed Care, Other (non HMO) | Admitting: Radiology

## 2022-10-19 ENCOUNTER — Emergency Department (HOSPITAL_BASED_OUTPATIENT_CLINIC_OR_DEPARTMENT_OTHER)
Admission: EM | Admit: 2022-10-19 | Discharge: 2022-10-19 | Disposition: A | Discharge status: Home / Self Care | Payer: Managed Care, Other (non HMO) | Attending: Emergency Medicine | Admitting: Emergency Medicine

## 2022-10-19 ENCOUNTER — Other Ambulatory Visit: Payer: Self-pay

## 2022-10-19 DIAGNOSIS — R0789 Other chest pain: Secondary | ICD-10-CM | POA: Insufficient documentation

## 2022-10-19 DIAGNOSIS — R079 Chest pain, unspecified: Secondary | ICD-10-CM

## 2022-10-19 LAB — BASIC METABOLIC PANEL
Anion gap: 10 (ref 5–15)
BUN: 21 mg/dL — ABNORMAL HIGH (ref 6–20)
CO2: 27 mmol/L (ref 22–32)
Calcium: 9.9 mg/dL (ref 8.9–10.3)
Chloride: 96 mmol/L — ABNORMAL LOW (ref 98–111)
Creatinine, Ser: 0.72 mg/dL (ref 0.44–1.00)
GFR, Estimated: >60 mL/min (ref >60)
Glucose, Bld: 87 mg/dL (ref 70–99)
Potassium: 3.8 mmol/L (ref 3.5–5.1)
Sodium: 133 mmol/L — ABNORMAL LOW (ref 135–145)

## 2022-10-19 LAB — CBC
HCT: 38.6 % (ref 36.0–46.0)
Hemoglobin: 13.1 g/dL (ref 12.0–15.0)
MCH: 30 pg (ref 26.0–34.0)
MCHC: 33.9 g/dL (ref 30.0–36.0)
MCV: 88.3 fL (ref 80.0–100.0)
Platelets: 263 10*3/uL (ref 150–400)
RBC: 4.37 MIL/uL (ref 3.87–5.11)
RDW: 12.7 % (ref 11.5–15.5)
WBC: 8.4 10*3/uL (ref 4.0–10.5)
nRBC: 0 % (ref 0.0–0.2)

## 2022-10-19 LAB — TROPONIN I (HIGH SENSITIVITY)
Troponin I (High Sensitivity): 2 ng/L (ref <18)
Troponin I (High Sensitivity): 2 ng/L (ref <18)

## 2022-10-19 MED ORDER — ASPIRIN 325 MG PO TABS
325.0000 mg | ORAL_TABLET | Freq: Every day | ORAL | Status: DC
  Administered 2022-10-19: 325 mg via ORAL
  Filled 2022-10-19: qty 1

## 2022-10-19 MED ORDER — IOHEXOL 350 MG/ML SOLN
100.0000 mL | Freq: Once | INTRAVENOUS | Status: AC | PRN
  Administered 2022-10-19: 75 mL via INTRAVENOUS

## 2022-10-19 NOTE — ED Provider Notes (Signed)
[place] [facility] AT [facility] Provider Note   CSN: [id number] Arrival date & time: 10/19/22  1451     History  Chief Complaint  Patient presents with   Chest Pain    Crystal Mayer is a 57 y.o. female.   Chest Pain    Patient with medical history of hyperlipidemia not on medication presents to the emergency department due to chest pain.  States this morning at 9 AM she woke up with a feeling of "hunger".  He was nauseated but it resolved.  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  It was constant, worsened throughout the day.  It was not improved with any positions, there is no associated vomiting or shortness of breath.  No lower extremity swelling.  Since checking the ED it has been coming and going, unable to if any provoking or alleviating factors.  She does states she took famotidine that her husband gave her which may have improved it although she has no history of GERD and she still having intermittent chest pain.  She has family history of ACS on her mother side, and no history of smoking, no hypertension, diabetes.  Recent travel to [place] 2 weeks ago.  Home Medications Prior to Admission medications   Medication Sig Start Date End Date Taking? Authorizing Provider  ibuprofen (ADVIL,MOTRIN) 200 MG tablet Take 200 mg by mouth every 6 (six) hours as needed.    [provider]  oxyCODONE-acetaminophen (PERCOCET) 10-325 MG tablet Take 1 tablet by mouth every 4 (four) hours as needed for pain (1 tablet every 4 to 6 hours PRN).    [provider]      Allergies    Patient has no known allergies.    Review of Systems   Review of Systems  Cardiovascular:  Positive for chest pain.    Physical Exam Updated Vital Signs BP 124/76   Pulse (!) 55   Temp 97.6 F (36.4 C) (Oral)   Resp 12   Ht  (1.499 m)   Wt 55.8 kg   SpO2 100%   BMI 24.84 kg/m   Physical Exam Vitals and nursing note reviewed. Exam conducted with a chaperone present.  Constitutional:      Appearance: Normal appearance.  HENT:     Head: Normocephalic and atraumatic.  Eyes:     General: No scleral icterus.       Right eye: No discharge.        Left eye: No discharge.     Extraocular Movements: Extraocular movements intact.     Pupils: Pupils are equal, round, and reactive to light.  Cardiovascular:     Rate and Rhythm: Normal rate and regular rhythm.     Pulses: Normal pulses.     Heart sounds: Normal heart sounds. No murmur heard.    No friction rub. No gallop.     Comments: Regular rate and rhythm, upper and lower extremity pulses are symmetric bilaterally Pulmonary:     Effort: Pulmonary effort is normal. No respiratory distress.     Breath sounds: Normal breath sounds.  Abdominal:     General: Abdomen is flat. Bowel sounds are normal. There is no distension.     Palpations: Abdomen is soft.     Tenderness: There is no abdominal tenderness.  Musculoskeletal:     Right lower leg: No edema.     Left lower leg: No edema.  Skin:    General: Skin is warm and dry.     Coloration: Skin is not jaundiced.  Neurological:     Mental Status: She is alert. Mental status is at baseline.     Coordination: Coordination normal.     ED Results / Procedures / Treatments   Labs (all labs ordered are listed, but only abnormal results are displayed) Labs Reviewed  BASIC METABOLIC PANEL - Abnormal; Notable for the following components:      Result Value   Sodium 133 (*)    Chloride 96 (*)    BUN 21 (*)    All other components within normal limits  CBC  TROPONIN I (HIGH SENSITIVITY)  TROPONIN I (HIGH SENSITIVITY)    EKG EKG Interpretation  Date/Time:  Tuesday October 19 2022 15:06:12 EDT Ventricular Rate:  74 PR Interval:  130 QRS Duration: 86 QT Interval:  402 QTC Calculation: 446 R Axis:   59 Text Interpretation: Normal sinus rhythm Normal ECG No  previous ECGs available Confirmed by Ernie Avena ([id number]) on 10/19/2022 3:12:16 PM  Radiology CT Angio Chest PE W/Cm &/Or Wo Cm  Result Date: 10/19/2022 CLINICAL DATA:  Chest pain and nausea. EXAM: CT ANGIOGRAPHY CHEST WITH CONTRAST TECHNIQUE: Multidetector CT imaging of the chest was performed using the standard protocol during bolus administration of intravenous contrast. Multiplanar CT image reconstructions and MIPs were obtained to evaluate the vascular anatomy. RADIATION DOSE REDUCTION: This exam was performed according to the departmental dose-optimization program which includes automated exposure control, adjustment of the mA and/or kV according to patient size and/or use of iterative reconstruction technique. CONTRAST:  75mL OMNIPAQUE IOHEXOL 350 MG/ML SOLN COMPARISON:  None Available. FINDINGS: Cardiovascular: Thoracic aorta is unremarkable. Satisfactory opacification of the pulmonary arteries to the segmental level. No evidence of pulmonary embolism. Normal heart size. No pericardial effusion. Mediastinum/Nodes: No enlarged mediastinal, hilar, or axillary lymph nodes. Thyroid gland, trachea, and esophagus demonstrate no significant findings. Lungs/Pleura: Very mild posterior right upper lobe linear scarring and/or atelectasis is seen. There is no evidence of an acute infiltrate, pleural effusion or pneumothorax. Upper Abdomen: No acute abnormality. Musculoskeletal: Multilevel degenerative changes are noted throughout the thoracic spine. Review of the MIP images confirms the above findings. IMPRESSION: 1. No evidence of pulmonary embolism or other acute intrathoracic process. Electronically Signed   By: Aram Candela M.D.   On: 10/19/2022 19:09   DG Chest 2 View  Result Date: 10/19/2022 CLINICAL DATA:  Chest pain. EXAM: CHEST - 2 VIEW COMPARISON:  None Available. FINDINGS: Subcentimeter nodular opacity in the right upper lobe, likely calcified granuloma. No consolidation or pulmonary edema.  Normal heart size and mediastinal contours. No pleural effusion or pneumothorax. Visualized bones and upper abdomen are unremarkable. IMPRESSION: No evidence of acute cardiopulmonary disease. Electronically Signed   By: Orvan Falconer M.D.   On: 10/19/2022 15:49    Procedures Procedures    Medications Ordered in ED Medications  aspirin tablet 325 mg (325 mg Oral Given 10/19/22 1848)  iohexol (OMNIPAQUE) 350 MG/ML injection 100 mL (75 mLs Intravenous Contrast Given 10/19/22 1831)    ED Course/ Medical Decision Making/ A&P Clinical Course as of 10/19/22 2312  Tue Oct 19, 2022  1736 EKG 12-Lead [KS]  1908 Troponin I (High Sensitivity) [KS]  1908 Troponin I (High Sensitivity): 2 [KS]    Clinical Course User Index [KS] Charleen Kirks, Student-PA  Medical Decision Making Amount and/or Complexity of Data Reviewed Labs: ordered. Radiology: ordered.  Risk OTC drugs. Prescription drug management.   Patient is a 57 year old female presenting to the emergency department due to chest pain.  Differential includes but limited to ACS, dissection, PE, pneumonia, pericarditis, myocarditis, arrhythmia, proximal esophagus.  His HPI with patient as well as by patient's husband who is at bedside. Reviewed external medical records, patient not currently on any medication, heart score is 2 due to family history of cardiovascular disease, history of lipidemia and age.  EKG shows sinus rhythm without any specific ischemic changes.  Cardiac monitoring shows sinus rhythm.  CBC leukocytosis or anemia.   No gross electrolyte derangement or AKI. Delta troponin is 0, troponin is nondetectable at 2.  Chest x-ray is negative for pneumonia, cardiomegaly or acute process.   CTA PE study ordered to evaluate for possible PE given recent travel and new onset of chest pain which was negative.  I reeval patient multiple times, no recurrence of her symptoms.  Will have her  follow-up with cardiology, ambulatory referral placed.  Stable for close outpatient follow-up at this time.        Final Clinical Impression(s) / ED Diagnoses Final diagnoses:  Chest pain, unspecified type    Rx / DC Orders ED Discharge Orders          Ordered    Ambulatory referral to Cardiology        10/19/22 1946              Lorita Officer 10/19/22 2312    Ernie Avena, MD 10/19/22 2332

## 2022-10-19 NOTE — Discharge Instructions (Signed)
You are seen today in the emergency department due to chest pain.  As we discussed your workup was very reassuring although we did not find a clear diagnosis.  Continue taking the famotidine at home and see if that helps with the symptoms.  Call and schedule follow-up with your primary the next 2 days for reevaluation, cardiology will call you in 72 hours schedule an outpatient follow-up.  If you do not hear from them: Schedule appointment for follow-up.  Return to the ED if you have any severe new symptoms such as syncope, lateralized weakness, coughing up blood, swelling to your legs, severe crushing back pain, radiation of the chest pain elsewhere or new or concerning symptoms.

## 2022-10-19 NOTE — ED Triage Notes (Signed)
Pt POV from home, caox4, ambulatory, NAD. Pt c/o CP onset approx 0930 this morning while at rest. Pt states initially started with nausea which she thought was "just hunger" but began having tightness in chest that has improved some throughout the day but still constant. Denies cardiac hx. Denies SOB, dizziness, N/V in triage.

## 2022-10-27 ENCOUNTER — Encounter: Payer: Self-pay | Admitting: Cardiology

## 2022-10-27 ENCOUNTER — Ambulatory Visit: Payer: Managed Care, Other (non HMO) | Admitting: Cardiology

## 2022-10-27 VITALS — BP 118/82 | HR 82 | Ht 59.0 in | Wt 127.0 lb

## 2022-10-27 DIAGNOSIS — R079 Chest pain, unspecified: Secondary | ICD-10-CM

## 2022-10-27 DIAGNOSIS — M79605 Pain in left leg: Secondary | ICD-10-CM | POA: Insufficient documentation

## 2022-10-27 DIAGNOSIS — R072 Precordial pain: Secondary | ICD-10-CM | POA: Insufficient documentation

## 2022-10-27 NOTE — Progress Notes (Signed)
Patient referred by Merri Brunette, MD for chest pain  Subjective:   Crystal Mayer, female    DOB: July 01, 1966, 57 y.o.   MRN: [id number]   Chief Complaint  Patient presents with   Chest Pain   New Patient (Initial Visit)     HPI  56 y.o. Caucasian female with chest/epigastric pain.  Patient has no significant past medical history. 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 She has had episodes of epigastric/lower chest pressure with severe hunger pains, no relation with exertion. She went to the ER, ACS was excluded. CTA chest showed no PE. There was no significant calcium noted in heart arteries. On a separate note, she has noticed pain in left calf for past 6 weeks.    Past Medical History:  Diagnosis Date   Hyperlipidemia      Past Surgical History:  Procedure Laterality Date   HERNIA REPAIR       Social History   Tobacco Use  Smoking Status Never  Smokeless Tobacco Not on file    Social History   Substance and Sexual Activity  Alcohol Use None   Comment: social     Family History  Problem Relation Age of Onset   Heart disease Mother    Heart attack Mother    Lung disease Father    Breast cancer Paternal Grandmother    Hodgkin's lymphoma Paternal Grandfather       Current Outpatient Medications:    cetirizine (ZYRTEC) 10 MG tablet, Take 10 mg by mouth daily., Disp: , Rfl:    ibuprofen (ADVIL) 200 MG tablet, Take 600 mg by mouth 2 (two) times daily., Disp: , Rfl:    omeprazole (PRILOSEC) 40 MG capsule, Take 40 mg by mouth daily., Disp: , Rfl:    Cardiovascular and other pertinent studies:  Reviewed external labs and tests, independently interpreted  EKG 10/27/2022: Sinus rhythm 68 bpm  Low voltage in precordial leads Otherwise normal EKG  CTA chest 10/19/2022: 1. No evidence of pulmonary embolism or other acute intrathoracic process.  Recent labs: 10/19/2022: Glucose 87, BUN/Cr 21/0.72. EGFR >60. Na/K 133/3.8.  Rest of the CMP normal Trop HS 2, 2 H/H 13/38. MCV 88. Platelets 263    Review of Systems  Cardiovascular:  Positive for chest pain. Negative for dyspnea on exertion, leg swelling, palpitations and syncope.         Vitals:   10/27/22 1430  BP: 118/82  Pulse: 82  SpO2: 97%     Body mass index is 25.65 kg/m. Filed Weights   10/27/22 1430  Weight: 127 lb (57.6 kg)     Objective:   Physical Exam Vitals and nursing note reviewed.  Constitutional:      General: She is not in acute distress. Neck:     Vascular: No JVD.  Cardiovascular:     Rate and Rhythm: Normal rate and regular rhythm.     Heart sounds: Normal heart sounds. No murmur heard. Pulmonary:     Effort: Pulmonary effort is normal.     Breath sounds: Normal breath sounds. No wheezing or rales.  Musculoskeletal:     Right lower leg: No edema.     Left lower leg: No edema.         Visit diagnoses:   ICD-10-CM   1. Chest pain of uncertain etiology  R07.9 EKG 12-Lead       Orders Placed This Encounter  Procedures   EKG 12-Lead  Assessment & Recommendations:    57 y.o. Caucasian female with chest/epigastric pain.  Chest/epigastric pain: Symptoms are very atypical for cardiac etiology. Normal resting EKG. Minimal ASCVD risk factors. Recommend exercise treadmill stress test.  Left leg pain: Will check ultrasound for DVT.   Thank you for referring the patient to [place]. Please feel free to contact with any questions.   Elder Negus, MD Pager: [phone] Office: [phone]

## 2022-11-12 ENCOUNTER — Ambulatory Visit: Payer: Managed Care, Other (non HMO)

## 2022-11-12 DIAGNOSIS — R072 Precordial pain: Secondary | ICD-10-CM

## 2022-11-30 ENCOUNTER — Other Ambulatory Visit: Payer: Managed Care, Other (non HMO)
